# Patient Record
Sex: Female | Born: 2000 | Race: White | Hispanic: No | Marital: Married | Smoking: Never smoker
Health system: Southern US, Community
[De-identification: ages and names within clinical notes are randomized; demographics above are authoritative.]

## PROBLEM LIST (undated history)

## (undated) HISTORY — PX: APPENDECTOMY: SHX54

---

## 2019-09-29 ENCOUNTER — Encounter (HOSPITAL_COMMUNITY): Payer: Self-pay

## 2019-09-29 ENCOUNTER — Other Ambulatory Visit: Payer: Self-pay

## 2019-09-29 ENCOUNTER — Emergency Department (HOSPITAL_COMMUNITY)
Admission: EM | Admit: 2019-09-29 | Discharge: 2019-09-29 | Disposition: A | Discharge status: Home / Self Care | Payer: Self-pay | Attending: Emergency Medicine | Admitting: Emergency Medicine

## 2019-09-29 DIAGNOSIS — R509 Fever, unspecified: Secondary | ICD-10-CM | POA: Insufficient documentation

## 2019-09-29 DIAGNOSIS — Z5321 Procedure and treatment not carried out due to patient leaving prior to being seen by health care provider: Secondary | ICD-10-CM | POA: Insufficient documentation

## 2019-09-29 DIAGNOSIS — R1110 Vomiting, unspecified: Secondary | ICD-10-CM | POA: Insufficient documentation

## 2019-09-29 DIAGNOSIS — R197 Diarrhea, unspecified: Secondary | ICD-10-CM | POA: Insufficient documentation

## 2019-09-29 DIAGNOSIS — R109 Unspecified abdominal pain: Secondary | ICD-10-CM | POA: Insufficient documentation

## 2019-09-29 LAB — COMPREHENSIVE METABOLIC PANEL
ALT: 17 U/L (ref 0–44)
AST: 20 U/L (ref 15–41)
Albumin: 3.8 g/dL (ref 3.5–5.0)
Alkaline Phosphatase: 47 U/L (ref 38–126)
Anion gap: 6 (ref 5–15)
BUN: 7 mg/dL (ref 6–20)
CO2: 25 mmol/L (ref 22–32)
Calcium: 8.8 mg/dL — ABNORMAL LOW (ref 8.9–10.3)
Chloride: 106 mmol/L (ref 98–111)
Creatinine, Ser: 0.75 mg/dL (ref 0.44–1.00)
GFR calc Af Amer: >60 mL/min (ref >60)
GFR calc non Af Amer: >60 mL/min (ref >60)
Glucose, Bld: 106 mg/dL — ABNORMAL HIGH (ref 70–99)
Potassium: 3.7 mmol/L (ref 3.5–5.1)
Sodium: 137 mmol/L (ref 135–145)
Total Bilirubin: 0.4 mg/dL (ref 0.3–1.2)
Total Protein: 6.6 g/dL (ref 6.5–8.1)

## 2019-09-29 LAB — CBC
HCT: 43.6 % (ref 36.0–46.0)
Hemoglobin: 14.3 g/dL (ref 12.0–15.0)
MCH: 29.8 pg (ref 26.0–34.0)
MCHC: 32.8 g/dL (ref 30.0–36.0)
MCV: 90.8 fL (ref 80.0–100.0)
Platelets: 265 10*3/uL (ref 150–400)
RBC: 4.8 MIL/uL (ref 3.87–5.11)
RDW: 12.5 % (ref 11.5–15.5)
WBC: 5.5 10*3/uL (ref 4.0–10.5)
nRBC: 0 % (ref 0.0–0.2)

## 2019-09-29 LAB — I-STAT BETA HCG BLOOD, ED (MC, WL, AP ONLY): I-stat hCG, quantitative: <5 m[IU]/mL (ref <5)

## 2019-09-29 LAB — LIPASE, BLOOD: Lipase: 29 U/L (ref 11–51)

## 2019-09-29 NOTE — ED Notes (Signed)
Pt called for room x2. No answer.  

## 2019-09-29 NOTE — ED Triage Notes (Signed)
Pt arrives POV for eval of abd pain, N/V/D x 5 days. Pt boyfriend w/ same complaints and sx. Reports a friend that they shared a drink with has been dx'd w/ mono. Reports subjective fevers/chills. Denies sore throat.

## 2019-10-03 ENCOUNTER — Other Ambulatory Visit: Payer: Self-pay

## 2019-10-03 ENCOUNTER — Encounter (HOSPITAL_COMMUNITY): Payer: Self-pay | Admitting: Emergency Medicine

## 2019-10-03 ENCOUNTER — Emergency Department (HOSPITAL_COMMUNITY): Payer: Self-pay

## 2019-10-03 ENCOUNTER — Emergency Department (HOSPITAL_COMMUNITY)
Admission: EM | Admit: 2019-10-03 | Discharge: 2019-10-03 | Disposition: A | Discharge status: Home / Self Care | Payer: Self-pay | Attending: Emergency Medicine | Admitting: Emergency Medicine

## 2019-10-03 DIAGNOSIS — R197 Diarrhea, unspecified: Secondary | ICD-10-CM | POA: Insufficient documentation

## 2019-10-03 DIAGNOSIS — R1011 Right upper quadrant pain: Secondary | ICD-10-CM

## 2019-10-03 DIAGNOSIS — R10816 Epigastric abdominal tenderness: Secondary | ICD-10-CM | POA: Insufficient documentation

## 2019-10-03 DIAGNOSIS — R112 Nausea with vomiting, unspecified: Secondary | ICD-10-CM | POA: Insufficient documentation

## 2019-10-03 DIAGNOSIS — R10811 Right upper quadrant abdominal tenderness: Secondary | ICD-10-CM | POA: Insufficient documentation

## 2019-10-03 DIAGNOSIS — R42 Dizziness and giddiness: Secondary | ICD-10-CM | POA: Insufficient documentation

## 2019-10-03 DIAGNOSIS — R1031 Right lower quadrant pain: Secondary | ICD-10-CM | POA: Insufficient documentation

## 2019-10-03 DIAGNOSIS — R531 Weakness: Secondary | ICD-10-CM | POA: Insufficient documentation

## 2019-10-03 LAB — CBC
HCT: 44.5 % (ref 36.0–46.0)
Hemoglobin: 14.9 g/dL (ref 12.0–15.0)
MCH: 30.1 pg (ref 26.0–34.0)
MCHC: 33.5 g/dL (ref 30.0–36.0)
MCV: 89.9 fL (ref 80.0–100.0)
Platelets: 280 10*3/uL (ref 150–400)
RBC: 4.95 MIL/uL (ref 3.87–5.11)
RDW: 12.4 % (ref 11.5–15.5)
WBC: 6.6 10*3/uL (ref 4.0–10.5)
nRBC: 0 % (ref 0.0–0.2)

## 2019-10-03 LAB — COMPREHENSIVE METABOLIC PANEL
ALT: 17 U/L (ref 0–44)
AST: 18 U/L (ref 15–41)
Albumin: 4.1 g/dL (ref 3.5–5.0)
Alkaline Phosphatase: 50 U/L (ref 38–126)
Anion gap: 9 (ref 5–15)
BUN: 13 mg/dL (ref 6–20)
CO2: 22 mmol/L (ref 22–32)
Calcium: 9 mg/dL (ref 8.9–10.3)
Chloride: 106 mmol/L (ref 98–111)
Creatinine, Ser: 0.69 mg/dL (ref 0.44–1.00)
GFR calc Af Amer: >60 mL/min (ref >60)
GFR calc non Af Amer: >60 mL/min (ref >60)
Glucose, Bld: 106 mg/dL — ABNORMAL HIGH (ref 70–99)
Potassium: 4 mmol/L (ref 3.5–5.1)
Sodium: 137 mmol/L (ref 135–145)
Total Bilirubin: 0.5 mg/dL (ref 0.3–1.2)
Total Protein: 6.7 g/dL (ref 6.5–8.1)

## 2019-10-03 LAB — I-STAT BETA HCG BLOOD, ED (MC, WL, AP ONLY): I-stat hCG, quantitative: <5 m[IU]/mL (ref <5)

## 2019-10-03 LAB — URINALYSIS, ROUTINE W REFLEX MICROSCOPIC
Bilirubin Urine: NEGATIVE
Glucose, UA: NEGATIVE mg/dL
Hgb urine dipstick: NEGATIVE
Ketones, ur: NEGATIVE mg/dL
Leukocytes,Ua: NEGATIVE
Nitrite: NEGATIVE
Protein, ur: NEGATIVE mg/dL
Specific Gravity, Urine: 1.02 (ref 1.005–1.030)
pH: 5 (ref 5.0–8.0)

## 2019-10-03 LAB — LIPASE, BLOOD: Lipase: 23 U/L (ref 11–51)

## 2019-10-03 MED ORDER — SODIUM CHLORIDE 0.9% FLUSH: 3.0000 mL | Freq: Once | INTRAVENOUS | Status: DC

## 2019-10-03 MED ORDER — KETOROLAC TROMETHAMINE 30 MG/ML IJ SOLN
30.0000 mg | Freq: Once | INTRAMUSCULAR | Status: AC
  Administered 2019-10-03: 30 mg via INTRAVENOUS
  Filled 2019-10-03: qty 1

## 2019-10-03 MED ORDER — PROMETHAZINE HCL 25 MG/ML IJ SOLN
12.5000 mg | Freq: Once | INTRAMUSCULAR | Status: AC
  Administered 2019-10-03: 12.5 mg via INTRAVENOUS
  Filled 2019-10-03: qty 1

## 2019-10-03 MED ORDER — PROMETHAZINE HCL 25 MG PO TABS
25.0000 mg | ORAL_TABLET | Freq: Four times a day (QID) | ORAL | 0 refills | Status: DC | PRN
Start: 2019-10-03 — End: 2020-03-19

## 2019-10-03 MED ORDER — SODIUM CHLORIDE 0.9 % IV BOLUS
1000.0000 mL | Freq: Once | INTRAVENOUS | Status: AC
  Administered 2019-10-03: 1000 mL via INTRAVENOUS

## 2019-10-03 MED ORDER — FAMOTIDINE 20 MG PO TABS
20.0000 mg | ORAL_TABLET | Freq: Two times a day (BID) | ORAL | 0 refills | Status: DC
Start: 2019-10-03 — End: 2020-02-20

## 2019-10-03 MED ORDER — SUCRALFATE 1 G PO TABS
1.0000 g | ORAL_TABLET | Freq: Three times a day (TID) | ORAL | 0 refills | Status: DC
Start: 2019-10-03 — End: 2020-03-19

## 2019-10-03 NOTE — ED Provider Notes (Signed)
[facility] EMERGENCY DEPARTMENT Provider Note   CSN: [id number] Arrival date & time: 10/03/19  1013     History Chief Complaint  Patient presents with  . Abdominal Pain  . Dizziness    Stacy Dougherty is a 19 y.o. female presents for evaluation of acute onset, persistent right sided abdominal pain for 1.5 weeks with associated nausea, vomiting, diarrhea for 5 days.  Reports feeling lightheaded and generally weak due to vomiting.  States that she has had at least 3 episodes of nonbloody nonbilious emesis daily for the last 5 days and several episodes of watery nonbloody diarrhea.  She denies urinary symptoms, vaginal itching, bleeding, discharge, fevers, chest pain, or shortness of breath.  She reports that her significant other also developed similar symptoms but his symptoms seem to be improving and hers have persisted.  She reports that she initially thought that their symptoms could have been due to cooking chicken that she deemed to be suspicious the day before their symptoms began.  She denies recent travel or recent treatment with antibiotics.  Her significant other was evaluated in the ED at some point and she states that he was diagnosed with gastritis.  She has been taking Zofran, smoking marijuana without relief of symptoms.  She also endorses sore throat that began after the vomiting and she states "I think is just the burning from the vomiting".  She also notes that her significant other shared a drink with someone who had mononucleosis.  She is status post appendectomy. The history is provided by the patient.       History reviewed. No pertinent past medical history.  There are no problems to display for this patient.   History reviewed. No pertinent surgical history.   OB History   No obstetric history on file.     History reviewed. No pertinent family history.  Social History   Tobacco Use  . Smoking status: Not on file  Substance Use Topics  .  Alcohol use: Not on file  . Drug use: Not on file    Home Medications Prior to Admission medications   Medication Sig Start Date End Date Taking? Authorizing Provider  acetaminophen (TYLENOL) 500 MG tablet Take 500-1,000 mg by mouth every 6 (six) hours as needed for mild pain or headache.   Yes [provider]  ibuprofen (ADVIL) 200 MG tablet Take 200-600 mg by mouth every 6 (six) hours as needed for headache or mild pain.   Yes [provider]  famotidine (PEPCID) 20 MG tablet Take 1 tablet (20 mg total) by mouth 2 (two) times daily. 10/03/19   Luevenia Maxin, Quantisha Marsicano A, PA-C  promethazine (PHENERGAN) 25 MG tablet Take 1 tablet (25 mg total) by mouth every 6 (six) hours as needed for nausea or vomiting. 10/03/19   Michela Pitcher A, PA-C  sucralfate (CARAFATE) 1 g tablet Take 1 tablet (1 g total) by mouth 4 (four) times daily -  with meals and at bedtime. 10/03/19   Michela Pitcher A, PA-C    Allergies    Latex  Review of Systems   Review of Systems  Constitutional: Negative for fever.  Respiratory: Negative for cough and shortness of breath.   Cardiovascular: Negative for chest pain.  Gastrointestinal: Positive for abdominal pain, diarrhea, nausea and vomiting.  Genitourinary: Negative for dysuria, frequency, hematuria, urgency, vaginal bleeding, vaginal discharge and vaginal pain.  All other systems reviewed and are negative.   Physical Exam Updated Vital Signs BP 112/67 (BP Location: Right  Arm)   Pulse 73   Temp 98.4 F (36.9 C) (Oral)   Resp 17   SpO2 98%   Physical Exam Vitals and nursing note reviewed.  Constitutional:      General: She is not in acute distress.    Appearance: She is well-developed.  HENT:     Head: Normocephalic and atraumatic.     Mouth/Throat:     Mouth: Mucous membranes are moist.     Pharynx: No pharyngeal swelling or oropharyngeal exudate.     Comments: Tolerating secretions without difficulty.  Posterior oropharynx without any erythema,  tonsillar hypertrophy, exudates, uvular deviation, trismus or sublingual abnormalities. Eyes:     General:        Right eye: No discharge.        Left eye: No discharge.     Conjunctiva/sclera: Conjunctivae normal.  Neck:     Vascular: No JVD.     Trachea: No tracheal deviation.  Cardiovascular:     Rate and Rhythm: Normal rate and regular rhythm.  Pulmonary:     Effort: Pulmonary effort is normal.     Breath sounds: Normal breath sounds.  Abdominal:     General: Abdomen is flat. A surgical scar is present. Bowel sounds are normal. There is no distension.     Palpations: Abdomen is soft.     Tenderness: There is abdominal tenderness in the right upper quadrant, right lower quadrant and epigastric area. There is no right CVA tenderness, left CVA tenderness, guarding or rebound. Negative signs include Murphy's sign and Rovsing's sign.  Skin:    General: Skin is warm and dry.     Findings: No erythema.  Neurological:     Mental Status: She is alert.  Psychiatric:        Behavior: Behavior normal.     ED Results / Procedures / Treatments   Labs (all labs ordered are listed, but only abnormal results are displayed) Labs Reviewed  COMPREHENSIVE METABOLIC PANEL - Abnormal; Notable for the following components:      Result Value   Glucose, Bld 106 (*)    All other components within normal limits  LIPASE, BLOOD  CBC  URINALYSIS, ROUTINE W REFLEX MICROSCOPIC  I-STAT BETA HCG BLOOD, ED ([facility], [facility], [facility] ONLY)    EKG None  Radiology US Abdomen Limited RUQ  Result Date: 10/03/2019 CLINICAL DATA:  Right upper quadrant pain EXAM: ULTRASOUND ABDOMEN LIMITED RIGHT UPPER QUADRANT COMPARISON:  None. FINDINGS: Gallbladder: No gallstones or wall thickening visualized. No sonographic Murphy sign noted by sonographer. Common bile duct: Diameter: 3 mm Liver: Liver is slightly echogenic. No focal hepatic abnormality portal vein is patent on color Doppler imaging with normal direction of blood flow  towards the liver. Other: None. IMPRESSION: 1. Negative for gallstones or biliary dilatation 2. Slightly echogenic liver as may be seen with steatosis Electronically Signed   By: Jasmine Pang M.D.   On: 10/03/2019 19:55    Procedures Procedures (including critical care time)  Medications Ordered in ED Medications  sodium chloride flush (NS) 0.9 % injection 3 mL (3 mLs Intravenous Not Given 10/03/19 1819)  sodium chloride 0.9 % bolus 1,000 mL (0 mLs Intravenous Stopped 10/03/19 2024)  promethazine (PHENERGAN) injection 12.5 mg (12.5 mg Intravenous Given 10/03/19 1937)  ketorolac (TORADOL) 30 MG/ML injection 30 mg (30 mg Intravenous Given 10/03/19 2105)    ED Course  I have reviewed the triage vital signs and the nursing notes.  Pertinent labs & imaging results that were  available during my care of the patient were reviewed by me and considered in my medical decision making (see chart for details).    MDM Rules/Calculators/A&P                          Patient with 1-1/2 weeks of abdominal pain with 5 days of nausea, vomiting, diarrhea.  She is afebrile, vital signs are stable.  She is nontoxic in appearance.  Also complained of sore throat but thinks it is due to the persistent vomiting.  Examination of the posterior oropharynx is not concerning for strep pharyngitis, PTA, mononucleosis, retropharyngeal abscess or deep space neck infection.  Her abdomen is soft with no rebound or guarding.  She is status post appendectomy.  Given her significant other had very similar symptoms after eating chicken and she has no GU complaints I doubt PID, TOA, ectopic pregnancy or ovarian torsion.  Given right-sided abdominal pains we obtained a right upper quadrant ultrasound which shows no evidence of gallstones or biliary dilatation.  Liver is slightly echogenic which could be seen in the setting of steatosis.  Lab work reviewed and interpreted by myself shows no leukocytosis, no anemia, no metabolic  derangements or renal insufficiency.  LFTs and lipase are within normal limits which is reassuring that she is likely not experiencing pancreatitis or other hepatobiliary dysfunction.  UA does not suggest UTI or nephrolithiasis.  Given the duration of her symptoms I would expect if there is a serious cause of her symptoms that we would see some lab abnormalities suggestive of this.  For this reason we did not obtain emergent CT scan of the abdomen and pelvis.  I doubt acute surgical abdominal pathology including obstruction, perforation, diverticulitis, nephrolithiasis.  On reevaluation the patient is resting comfortably in no apparent distress.  Reports she is feeling better after fluids, Phenergan, and Toradol.  She has tolerated sips of fluid in the ED.  Serial abdominal examinations remain benign.  Suspect likely gastroenteritis.  Will discharge with symptomatic management and discussed advancing diet slowly, pushing fluids.  Also offered outpatient Covid testing which she declined.  Recommend follow-up with PCP on an outpatient basis for reevaluation of symptoms.  Discussed strict ED return precautions.  Patient and visitor at the bedside verbalized understanding of and agreement with plan and patient is stable for discharge at this time.  Final Clinical Impression(s) / ED Diagnoses Final diagnoses:  RUQ abdominal pain  Nausea vomiting and diarrhea    Rx / DC Orders ED Discharge Orders         Ordered    promethazine (PHENERGAN) 25 MG tablet  Every 6 hours PRN     Discontinue  Reprint     10/03/19 2107    famotidine (PEPCID) 20 MG tablet  2 times daily     Discontinue  Reprint     10/03/19 2107    sucralfate (CARAFATE) 1 g tablet  3 times daily with meals & bedtime     Discontinue  Reprint     10/03/19 2107           Renita Papa, PA-C 10/03/19 2112    Drenda Freeze, MD 10/03/19 [phone]

## 2019-10-03 NOTE — ED Notes (Signed)
Pt given sprite to drink. 

## 2019-10-03 NOTE — Discharge Instructions (Signed)
1. Medications: Alternate 600 mg of ibuprofen and [phone] mg of Tylenol every 3 hours as needed for pain. Do not exceed 4000 mg of Tylenol daily.  Take ibuprofen with food to avoid upset stomach.  Take Phenergan as needed for nausea.  Wait around 20 minutes before eating or drinking after taking this medication.  Do not take Zofran at the same time as you take Phenergan.  Take famotidine twice daily with meals.  Carafate with meals and at night. 2. Treatment: rest, drink plenty of fluids, advance diet slowly.  Start with water and broth then advance to bland foods that will not upset your stomach such as crackers, mashed potatoes, and peanut butter. 3. Follow Up: Please followup with your primary doctor in 3 days for discussion of your diagnoses and further evaluation after today's visit; if you do not have a primary care doctor use the resource guide provided to find one; Please return to the ER for persistent vomiting, high fevers or worsening symptoms

## 2019-10-03 NOTE — ED Notes (Signed)
Pt also states her apartment is filled with black mold

## 2019-10-03 NOTE — ED Triage Notes (Signed)
Patient arrives to ED with complaints of RLQ abdominal pain, dizziness, N/V/D, and sore throat x1.5 weeks. Patient states that she feels like she is continuing to get worse. Was seen here for same on 6/18. Boyfriend has same complaints.

## 2019-10-03 NOTE — ED Notes (Signed)
Taken to US.

## 2019-10-03 NOTE — ED Notes (Signed)
Pt transported to US

## 2020-02-20 ENCOUNTER — Emergency Department (HOSPITAL_COMMUNITY)
Admission: EM | Admit: 2020-02-20 | Discharge: 2020-02-20 | Disposition: A | Discharge status: Home / Self Care | Payer: Medicaid Other | Attending: Emergency Medicine | Admitting: Emergency Medicine

## 2020-02-20 ENCOUNTER — Encounter (HOSPITAL_COMMUNITY): Payer: Self-pay | Admitting: Emergency Medicine

## 2020-02-20 ENCOUNTER — Other Ambulatory Visit: Payer: Self-pay

## 2020-02-20 DIAGNOSIS — R197 Diarrhea, unspecified: Secondary | ICD-10-CM | POA: Insufficient documentation

## 2020-02-20 DIAGNOSIS — R112 Nausea with vomiting, unspecified: Secondary | ICD-10-CM | POA: Insufficient documentation

## 2020-02-20 DIAGNOSIS — R5383 Other fatigue: Secondary | ICD-10-CM | POA: Insufficient documentation

## 2020-02-20 DIAGNOSIS — Z91040 Latex allergy status: Secondary | ICD-10-CM | POA: Diagnosis not present

## 2020-02-20 DIAGNOSIS — R6883 Chills (without fever): Secondary | ICD-10-CM | POA: Diagnosis not present

## 2020-02-20 LAB — CBC
HCT: 44.5 % (ref 36.0–46.0)
Hemoglobin: 15.1 g/dL — ABNORMAL HIGH (ref 12.0–15.0)
MCH: 30.1 pg (ref 26.0–34.0)
MCHC: 33.9 g/dL (ref 30.0–36.0)
MCV: 88.8 fL (ref 80.0–100.0)
Platelets: 314 10*3/uL (ref 150–400)
RBC: 5.01 MIL/uL (ref 3.87–5.11)
RDW: 12.6 % (ref 11.5–15.5)
WBC: 9.2 10*3/uL (ref 4.0–10.5)
nRBC: 0 % (ref 0.0–0.2)

## 2020-02-20 LAB — COMPREHENSIVE METABOLIC PANEL
ALT: 16 U/L (ref 0–44)
AST: 17 U/L (ref 15–41)
Albumin: 4.5 g/dL (ref 3.5–5.0)
Alkaline Phosphatase: 53 U/L (ref 38–126)
Anion gap: 13 (ref 5–15)
BUN: 9 mg/dL (ref 6–20)
CO2: 20 mmol/L — ABNORMAL LOW (ref 22–32)
Calcium: 9.4 mg/dL (ref 8.9–10.3)
Chloride: 103 mmol/L (ref 98–111)
Creatinine, Ser: 0.74 mg/dL (ref 0.44–1.00)
GFR, Estimated: >60 mL/min (ref >60)
Glucose, Bld: 92 mg/dL (ref 70–99)
Potassium: 3.6 mmol/L (ref 3.5–5.1)
Sodium: 136 mmol/L (ref 135–145)
Total Bilirubin: 1.1 mg/dL (ref 0.3–1.2)
Total Protein: 7.2 g/dL (ref 6.5–8.1)

## 2020-02-20 LAB — URINALYSIS, ROUTINE W REFLEX MICROSCOPIC
Bilirubin Urine: NEGATIVE
Glucose, UA: NEGATIVE mg/dL
Hgb urine dipstick: NEGATIVE
Ketones, ur: 80 mg/dL — AB
Nitrite: NEGATIVE
Protein, ur: 30 mg/dL — AB
Specific Gravity, Urine: 1.031 — ABNORMAL HIGH (ref 1.005–1.030)
pH: 5 (ref 5.0–8.0)

## 2020-02-20 LAB — LIPASE, BLOOD: Lipase: 26 U/L (ref 11–51)

## 2020-02-20 LAB — I-STAT BETA HCG BLOOD, ED (MC, WL, AP ONLY): I-stat hCG, quantitative: <5 m[IU]/mL (ref <5)

## 2020-02-20 MED ORDER — DICYCLOMINE HCL 10 MG PO CAPS
20.0000 mg | ORAL_CAPSULE | Freq: Once | ORAL | Status: AC
  Administered 2020-02-20: 20 mg via ORAL
  Filled 2020-02-20: qty 2

## 2020-02-20 MED ORDER — FAMOTIDINE 20 MG PO TABS: 20.0000 mg | ORAL_TABLET | Freq: Two times a day (BID) | ORAL | 0 refills | Status: AC

## 2020-02-20 MED ORDER — FAMOTIDINE 20 MG PO TABS
20.0000 mg | ORAL_TABLET | Freq: Once | ORAL | Status: AC
  Administered 2020-02-20: 20 mg via ORAL
  Filled 2020-02-20: qty 1

## 2020-02-20 MED ORDER — DICYCLOMINE HCL 20 MG PO TABS: 20.0000 mg | ORAL_TABLET | Freq: Two times a day (BID) | ORAL | 0 refills | Status: AC

## 2020-02-20 MED ORDER — ONDANSETRON 4 MG PO TBDP: 4.0000 mg | ORAL_TABLET | Freq: Three times a day (TID) | ORAL | 0 refills | Status: DC | PRN

## 2020-02-20 MED ORDER — ONDANSETRON 4 MG PO TBDP
4.0000 mg | ORAL_TABLET | Freq: Once | ORAL | Status: AC
  Administered 2020-02-20: 4 mg via ORAL
  Filled 2020-02-20: qty 1

## 2020-02-20 NOTE — ED Notes (Signed)
Pt states she feels a lot better after the medications. Will continue to monitor.

## 2020-02-20 NOTE — ED Provider Notes (Signed)
[facility] EMERGENCY DEPARTMENT Provider Note   CSN: [id number] Arrival date & time: 02/20/20  1242     History Chief Complaint  Patient presents with  . Nausea  . Emesis  . Fatigue    Stacy Dougherty is a 19 y.o. female.  HPI Patient is a 19 year old female with no pertinent past medical history presented today with nausea vomiting and diarrhea for approximately 5 days.  She denies any fevers, hematuria, frequency urgency or dysuria.  She denies any blood in her stool denies any fevers.  She has had some chills at night.  She states that she has been drinking lots of water and Gatorade.  She denies any sick contacts no recent travel out of the country.  She denies any significant abdominal pain either.  She states that sometimes it feels somewhat crampy when she first wakes up in the morning but is relieved by having a bowel movement.  She denies any present chest pain or abdominal pain.  She denies any lightheadedness, dizziness, weakness or malaise.  She denies any other associated symptoms.  She denies any aggravating or mitigating factors.  She has tried no medications prior to arrival.  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  She states this does not feel like her ulcers.    History reviewed. No pertinent past medical history.  There are no problems to display for this patient.   History reviewed. No pertinent surgical history.   OB History   No obstetric history on file.     History reviewed. No pertinent family history.  Social History   Tobacco Use  . Smoking status: Not on file  Substance Use Topics  . Alcohol use: Not on file  . Drug use: Not on file    Home Medications Prior to Admission medications   Medication Sig Start Date End Date Taking? Authorizing Provider  acetaminophen (TYLENOL) 500 MG tablet Take 500-1,000 mg by mouth  every 6 (six) hours as needed for mild pain or headache.    [provider]  dicyclomine (BENTYL) 20 MG tablet Take 1 tablet (20 mg total) by mouth 2 (two) times daily. 02/20/20   Gailen Shelter, PA  famotidine (PEPCID) 20 MG tablet Take 1 tablet (20 mg total) by mouth 2 (two) times daily. 02/20/20   Gailen Shelter, PA  ibuprofen (ADVIL) 200 MG tablet Take 200-600 mg by mouth every 6 (six) hours as needed for headache or mild pain.    [provider]  ondansetron (ZOFRAN ODT) 4 MG disintegrating tablet Take 1 tablet (4 mg total) by mouth every 8 (eight) hours as needed for nausea or vomiting. 02/20/20   Gailen Shelter, PA  promethazine (PHENERGAN) 25 MG tablet Take 1 tablet (25 mg total) by mouth every 6 (six) hours as needed for nausea or vomiting. 10/03/19   Michela Pitcher A, PA-C  sucralfate (CARAFATE) 1 g tablet Take 1 tablet (1 g total) by mouth 4 (four) times daily -  with meals and at bedtime. 10/03/19   Michela Pitcher A, PA-C    Allergies    Latex  Review of Systems   Review of Systems  Constitutional: Positive for chills and fatigue. Negative for fever.  HENT: Negative for congestion.   Eyes: Negative for pain.  Respiratory: Negative for cough and shortness of breath.   Cardiovascular: Negative for chest  pain and leg swelling.  Gastrointestinal: Positive for diarrhea, nausea and vomiting. Negative for abdominal distention and abdominal pain.  Genitourinary: Negative for dysuria.  Musculoskeletal: Negative for myalgias.  Skin: Negative for rash.  Neurological: Negative for dizziness and headaches.    Physical Exam Updated Vital Signs BP 117/71 (BP Location: Right Arm)   Pulse 63   Temp 98.3 F (36.8 C) (Oral)   Resp 16   SpO2 96%   Physical Exam Vitals and nursing note reviewed.  Constitutional:      General: She is not in acute distress. HENT:     Head: Normocephalic and atraumatic.     Nose: Nose normal.  Eyes:     General: No scleral  icterus. Cardiovascular:     Rate and Rhythm: Normal rate and regular rhythm.     Pulses: Normal pulses.     Heart sounds: Normal heart sounds.  Pulmonary:     Effort: Pulmonary effort is normal. No respiratory distress.     Breath sounds: No wheezing.  Abdominal:     Palpations: Abdomen is soft.     Tenderness: There is no abdominal tenderness.  Musculoskeletal:     Cervical back: Normal range of motion.     Right lower leg: No edema.     Left lower leg: No edema.  Skin:    General: Skin is warm and dry.     Capillary Refill: Capillary refill takes less than 2 seconds.  Neurological:     Mental Status: She is alert. Mental status is at baseline.  Psychiatric:        Mood and Affect: Mood normal.        Behavior: Behavior normal.     ED Results / Procedures / Treatments   Labs (all labs ordered are listed, but only abnormal results are displayed) Labs Reviewed  COMPREHENSIVE METABOLIC PANEL - Abnormal; Notable for the following components:      Result Value   CO2 20 (*)    All other components within normal limits  CBC - Abnormal; Notable for the following components:   Hemoglobin 15.1 (*)    All other components within normal limits  LIPASE, BLOOD  URINALYSIS, ROUTINE W REFLEX MICROSCOPIC  I-STAT BETA HCG BLOOD, ED ([facility], [facility], [facility] ONLY)    EKG None  Radiology No results found.  Procedures Procedures (including critical care time)  Medications Ordered in ED Medications  dicyclomine (BENTYL) capsule 20 mg (20 mg Oral Given 02/20/20 1510)  famotidine (PEPCID) tablet 20 mg (20 mg Oral Given 02/20/20 1510)  ondansetron (ZOFRAN-ODT) disintegrating tablet 4 mg (4 mg Oral Given 02/20/20 1510)    ED Course  I have reviewed the triage vital signs and the nursing notes.  Pertinent labs & imaging results that were available during my care of the patient were reviewed by me and considered in my medical decision making (see chart for details).    MDM  Rules/Calculators/A&P                          Patient is 19 year old female with no pertinent past medical history apart from daily marijuana use presented today with some chills diarrhea nausea and vomiting.  He has been ongoing for approximately 5 days at this point.  She denies any fevers at home.  Physical exam is notable for no significant tenderness in the abdomen no guarding or rebound.  No significant abnormal findings.  She has no CVA tenderness and no  suprapubic tenderness.  She is not having any urinary symptoms consistent with UTI.  She is afebrile and has vital signs within normal limits.  Provided patient with Zofran Bentyl and famotidine  On my reassessment she feels significantly improved and is agreeable to discharge home at this time.  CBC with mild hemoconcentration likely from dehydration.  No leukocytosis or anemia. CMP without any significant abnormalities very mildly low CO2. Lipase within normal limits pancreatitis.  I-STAT hCG negative for pregnancy.  Any lower abdominal pain that would be indicative of torsion, ectopic, PID.  She also denies any vaginal or pelvic complaints.  She deferred pelvic and exam which I think is reasonable this time.  We will discharge with conservative treatment at this time Zofran Bentyl and famotidine she will follow up with her primary care doctor return to ED if she has any new or concerning symptoms.  Final Clinical Impression(s) / ED Diagnoses Final diagnoses:  Nausea vomiting and diarrhea    Rx / DC Orders ED Discharge Orders         Ordered    ondansetron (ZOFRAN ODT) 4 MG disintegrating tablet  Every 8 hours PRN        02/20/20 1612    famotidine (PEPCID) 20 MG tablet  2 times daily        02/20/20 1612    dicyclomine (BENTYL) 20 MG tablet  2 times daily        02/20/20 1612           [place], [place] 02/20/20 1616    Cathren Laine, MD 02/22/20 1542

## 2020-02-20 NOTE — Discharge Instructions (Signed)
Take medications as prescribed.  Please drink plenty of water.  Please monitor your symptoms and return the emergency department for new or concerning symptoms.  Refrain from using ibuprofen or alcohol. Also refrain from using marijuana

## 2020-02-20 NOTE — ED Notes (Signed)
Patient tolerating oral fluids well at this time

## 2020-02-20 NOTE — ED Triage Notes (Signed)
Pt coming from home. Complaint of nausea vomiting and fatigue for 4 days. Pt states she has not been able to keep any food down.

## 2020-03-08 ENCOUNTER — Emergency Department (HOSPITAL_COMMUNITY)
Admission: EM | Admit: 2020-03-08 | Discharge: 2020-03-08 | Disposition: A | Discharge status: Home / Self Care | Payer: Medicaid Other | Attending: Emergency Medicine | Admitting: Emergency Medicine

## 2020-03-08 ENCOUNTER — Emergency Department (HOSPITAL_COMMUNITY): Payer: Medicaid Other

## 2020-03-08 ENCOUNTER — Other Ambulatory Visit: Payer: Self-pay

## 2020-03-08 DIAGNOSIS — R52 Pain, unspecified: Secondary | ICD-10-CM

## 2020-03-08 DIAGNOSIS — R350 Frequency of micturition: Secondary | ICD-10-CM | POA: Diagnosis not present

## 2020-03-08 DIAGNOSIS — R109 Unspecified abdominal pain: Secondary | ICD-10-CM | POA: Insufficient documentation

## 2020-03-08 DIAGNOSIS — Z91040 Latex allergy status: Secondary | ICD-10-CM | POA: Insufficient documentation

## 2020-03-08 DIAGNOSIS — R300 Dysuria: Secondary | ICD-10-CM | POA: Insufficient documentation

## 2020-03-08 LAB — I-STAT BETA HCG BLOOD, ED (MC, WL, AP ONLY): I-stat hCG, quantitative: >2000 m[IU]/mL — ABNORMAL HIGH (ref <5)

## 2020-03-08 LAB — CBC WITH DIFFERENTIAL/PLATELET
Abs Immature Granulocytes: 0.02 10*3/uL (ref 0.00–0.07)
Basophils Absolute: 0 10*3/uL (ref 0.0–0.1)
Basophils Relative: 1 %
Eosinophils Absolute: 0.1 10*3/uL (ref 0.0–0.5)
Eosinophils Relative: 1 %
HCT: 44.1 % (ref 36.0–46.0)
Hemoglobin: 15.1 g/dL — ABNORMAL HIGH (ref 12.0–15.0)
Immature Granulocytes: 0 %
Lymphocytes Relative: 24 %
Lymphs Abs: 1.7 10*3/uL (ref 0.7–4.0)
MCH: 30.6 pg (ref 26.0–34.0)
MCHC: 34.2 g/dL (ref 30.0–36.0)
MCV: 89.3 fL (ref 80.0–100.0)
Monocytes Absolute: 0.5 10*3/uL (ref 0.1–1.0)
Monocytes Relative: 7 %
Neutro Abs: 4.8 10*3/uL (ref 1.7–7.7)
Neutrophils Relative %: 67 %
Platelets: 270 10*3/uL (ref 150–400)
RBC: 4.94 MIL/uL (ref 3.87–5.11)
RDW: 13 % (ref 11.5–15.5)
WBC: 7.1 10*3/uL (ref 4.0–10.5)
nRBC: 0 % (ref 0.0–0.2)

## 2020-03-08 LAB — WET PREP, GENITAL
Sperm: NONE SEEN
Trich, Wet Prep: NONE SEEN
Yeast Wet Prep HPF POC: NONE SEEN

## 2020-03-08 LAB — HIV ANTIBODY (ROUTINE TESTING W REFLEX): HIV Screen 4th Generation wRfx: NONREACTIVE

## 2020-03-08 LAB — BASIC METABOLIC PANEL
Anion gap: 10 (ref 5–15)
BUN: 10 mg/dL (ref 6–20)
CO2: 20 mmol/L — ABNORMAL LOW (ref 22–32)
Calcium: 8.9 mg/dL (ref 8.9–10.3)
Chloride: 106 mmol/L (ref 98–111)
Creatinine, Ser: 0.78 mg/dL (ref 0.44–1.00)
GFR, Estimated: >60 mL/min (ref >60)
Glucose, Bld: 105 mg/dL — ABNORMAL HIGH (ref 70–99)
Potassium: 3.7 mmol/L (ref 3.5–5.1)
Sodium: 136 mmol/L (ref 135–145)

## 2020-03-08 MED ORDER — ONDANSETRON 4 MG PO TBDP
8.0000 mg | ORAL_TABLET | Freq: Once | ORAL | Status: AC
  Administered 2020-03-08: 8 mg via ORAL
  Filled 2020-03-08: qty 2

## 2020-03-08 NOTE — Discharge Instructions (Addendum)
Follow-up with a obstetrician for further care in 2 weeks.  You will need repeat ultrasound and repeat blood testing.  You can call the Center for [facility] at [facility], to get an appointment.  Phone number is [phone]  If you experience severe pain, vaginal bleeding, dizziness or weakness go to the maternity admissions unit, at [facility]

## 2020-03-08 NOTE — ED Notes (Signed)
Pt provided with peanut butter and crackers per Dr Effie Shy ok

## 2020-03-08 NOTE — ED Provider Notes (Signed)
[facility] EMERGENCY DEPARTMENT Provider Note   CSN: [id number] Arrival date & time: 03/08/20  2751     History No chief complaint on file.   Stacy Dougherty is a 19 y.o. female.  HPI She presents for evaluation of cramping abdominal pain with missed menses for 3 weeks.  She also complains of dysuria with urinary frequency.  She has abdominal pain extending from the epigastrium to the suprapubic region, vagina and anus.  She denies fever but has felt hot at times.  She is not having cough, chest pain, weakness or dizziness.  She states that she is trying to get pregnant.  She has not had Covid vaccines.  There are no other known modifying factors.    No past medical history on file.  There are no problems to display for this patient.   No past surgical history on file.   OB History   No obstetric history on file.     No family history on file.  Social History   Tobacco Use  . Smoking status: Not on file  Substance Use Topics  . Alcohol use: Not on file  . Drug use: Not on file    Home Medications Prior to Admission medications   Medication Sig Start Date End Date Taking? Authorizing Provider  acetaminophen (TYLENOL) 500 MG tablet Take 500-1,000 mg by mouth every 6 (six) hours as needed for mild pain or headache.    [provider]  dicyclomine (BENTYL) 20 MG tablet Take 1 tablet (20 mg total) by mouth 2 (two) times daily. 02/20/20   Gailen Shelter, PA  famotidine (PEPCID) 20 MG tablet Take 1 tablet (20 mg total) by mouth 2 (two) times daily. 02/20/20   Gailen Shelter, PA  ibuprofen (ADVIL) 200 MG tablet Take 200-600 mg by mouth every 6 (six) hours as needed for headache or mild pain.    [provider]  ondansetron (ZOFRAN ODT) 4 MG disintegrating tablet Take 1 tablet (4 mg total) by mouth every 8 (eight) hours as needed for nausea or vomiting. 02/20/20   Gailen Shelter, PA  promethazine (PHENERGAN) 25 MG tablet Take 1 tablet (25  mg total) by mouth every 6 (six) hours as needed for nausea or vomiting. 10/03/19   Michela Pitcher A, PA-C  sucralfate (CARAFATE) 1 g tablet Take 1 tablet (1 g total) by mouth 4 (four) times daily -  with meals and at bedtime. 10/03/19   Michela Pitcher A, PA-C    Allergies    Latex  Review of Systems   Review of Systems  All other systems reviewed and are negative.   Physical Exam Updated Vital Signs There were no vitals taken for this visit.  Physical Exam Vitals and nursing note reviewed.  Constitutional:      Appearance: She is well-developed.  HENT:     Head: Normocephalic and atraumatic.     Mouth/Throat:     Mouth: Mucous membranes are moist.     Pharynx: No oropharyngeal exudate or posterior oropharyngeal erythema.  Eyes:     Conjunctiva/sclera: Conjunctivae normal.     Pupils: Pupils are equal, round, and reactive to light.  Neck:     Trachea: Phonation normal.  Cardiovascular:     Rate and Rhythm: Normal rate and regular rhythm.  Pulmonary:     Effort: Pulmonary effort is normal.     Breath sounds: Normal breath sounds.  Chest:     Chest wall: No tenderness.  Abdominal:  General: There is no distension.     Palpations: Abdomen is soft.     Tenderness: There is no abdominal tenderness. There is no guarding.  Genitourinary:    Comments: Normal external female genitalia.  Small amount of vaginal discharge, slightly opaque.  On bimanual examination there is mild left adnexal tenderness without palpable mass or deformity.  Uterus is not enlarged.  No tenderness or mass on the right adnexa Musculoskeletal:        General: Normal range of motion.     Cervical back: Normal range of motion and neck supple.  Skin:    General: Skin is warm and dry.  Neurological:     Mental Status: She is alert and oriented to person, place, and time.     Motor: No abnormal muscle tone.  Psychiatric:        Mood and Affect: Mood normal.        Behavior: Behavior normal.        Thought  Content: Thought content normal.        Judgment: Judgment normal.     ED Results / Procedures / Treatments   Labs (all labs ordered are listed, but only abnormal results are displayed) Labs Reviewed - No data to display  EKG None  Radiology No results found.  Procedures Procedures (including critical care time)  Medications Ordered in ED Medications - No data to display  ED Course  I have reviewed the triage vital signs and the nursing notes.  Pertinent labs & imaging results that were available during my care of the patient were reviewed by me and considered in my medical decision making (see chart for details).    MDM Rules/Calculators/A&P                           Patient Vitals for the past 24 hrs:  BP Temp Temp src Pulse Resp SpO2  03/08/20 0809 128/80 97.9 F (36.6 C) Oral 94 18 99 %    5:13 PM Reevaluation with update and discussion. After initial assessment and treatment, an updated evaluation reveals she is comfortable, vital signs stable.  Findings discussed with patient all questions were answered. Mancel Bale   Medical Decision Making:  This patient is presenting for evaluation of abdominal pain and missed menses, which does require a range of treatment options, and is a complaint that involves a high risk of morbidity and mortality. The differential diagnoses include UTI, pregnancy, intestinal disorder. I decided to review old records, and in summary healthy young female presenting with missed menses and abdominal pain.  Frequent ED visits for gastrointestinal difficulty.  I did not require additional historical information from anyone.  Clinical Laboratory Tests Ordered, included CBC, Metabolic panel, Pregnancy test and Quantitative hCG, wet prep, GC and Chlamydia testing, HIV testing. Review indicates positive pregnancy test, few clue cells on wet prep, with many RBCs.  No clinical evidence for cervicitis. Radiologic Tests Ordered, included ultrasound  pelvis.  I independently Visualized: Uncomplicated 5-week pregnancy images     Critical Interventions-clinical evaluation, laboratory testing, ultrasound imaging, observation reassessment  After These Interventions, the Patient was reevaluated and was found stable for discharge.  At this point pregnancy appears uncomplicated.  She is not having vaginal bleeding.  Pain is controlled she does not have an obstetrician so was referred to the outpatient [facility] center for women.  She is instructed to return to the [facility] if needed for complications  CRITICAL CARE-no Performed  by: Mancel Bale  Nursing Notes Reviewed/ Care Coordinated Applicable Imaging Reviewed Interpretation of Laboratory Data incorporated into ED treatment  The patient appears reasonably screened and/or stabilized for discharge and I doubt any other medical condition or other [facility] requiring further screening, evaluation, or treatment in the ED at this time prior to discharge.  Plan: Home Medications-OTC as needed; Home Treatments-rest, fluids; return here if the recommended treatment, does not improve the symptoms; Recommended follow up-PCP, as needed     Final Clinical Impression(s) / ED Diagnoses Final diagnoses:  None    Rx / DC Orders ED Discharge Orders    None       Mancel Bale, MD 03/08/20 1715

## 2020-03-09 LAB — RPR: RPR Ser Ql: NONREACTIVE

## 2020-03-11 LAB — GC/CHLAMYDIA PROBE AMP (~~LOC~~) NOT AT ARMC
Chlamydia: NEGATIVE
Comment: NEGATIVE
Comment: NORMAL
Neisseria Gonorrhea: NEGATIVE

## 2020-03-19 ENCOUNTER — Inpatient Hospital Stay (HOSPITAL_COMMUNITY)
Admission: AD | Admit: 2020-03-19 | Discharge: 2020-03-19 | Disposition: A | Discharge status: Home / Self Care | Payer: Medicaid Other | Attending: Emergency Medicine | Admitting: Emergency Medicine

## 2020-03-19 ENCOUNTER — Encounter (HOSPITAL_COMMUNITY): Payer: Self-pay | Admitting: *Deleted

## 2020-03-19 ENCOUNTER — Inpatient Hospital Stay (HOSPITAL_COMMUNITY): Payer: Medicaid Other

## 2020-03-19 ENCOUNTER — Other Ambulatory Visit: Payer: Self-pay

## 2020-03-19 DIAGNOSIS — O211 Hyperemesis gravidarum with metabolic disturbance: Secondary | ICD-10-CM | POA: Diagnosis not present

## 2020-03-19 DIAGNOSIS — O210 Mild hyperemesis gravidarum: Secondary | ICD-10-CM

## 2020-03-19 DIAGNOSIS — Z3A01 Less than 8 weeks gestation of pregnancy: Secondary | ICD-10-CM | POA: Insufficient documentation

## 2020-03-19 DIAGNOSIS — E876 Hypokalemia: Secondary | ICD-10-CM | POA: Diagnosis not present

## 2020-03-19 DIAGNOSIS — O219 Vomiting of pregnancy, unspecified: Secondary | ICD-10-CM | POA: Insufficient documentation

## 2020-03-19 DIAGNOSIS — Z91040 Latex allergy status: Secondary | ICD-10-CM | POA: Diagnosis not present

## 2020-03-19 LAB — URINALYSIS, ROUTINE W REFLEX MICROSCOPIC
Bilirubin Urine: NEGATIVE
Glucose, UA: NEGATIVE mg/dL
Hgb urine dipstick: NEGATIVE
Ketones, ur: 20 mg/dL — AB
Leukocytes,Ua: NEGATIVE
Nitrite: NEGATIVE
Protein, ur: NEGATIVE mg/dL
Specific Gravity, Urine: 1.03 (ref 1.005–1.030)
pH: 5 (ref 5.0–8.0)

## 2020-03-19 LAB — COMPREHENSIVE METABOLIC PANEL
ALT: 14 U/L (ref 0–44)
AST: 16 U/L (ref 15–41)
Albumin: 3.8 g/dL (ref 3.5–5.0)
Alkaline Phosphatase: 46 U/L (ref 38–126)
Anion gap: 11 (ref 5–15)
BUN: 7 mg/dL (ref 6–20)
CO2: 22 mmol/L (ref 22–32)
Calcium: 9 mg/dL (ref 8.9–10.3)
Chloride: 103 mmol/L (ref 98–111)
Creatinine, Ser: 0.71 mg/dL (ref 0.44–1.00)
GFR, Estimated: >60 mL/min (ref >60)
Glucose, Bld: 101 mg/dL — ABNORMAL HIGH (ref 70–99)
Potassium: 3.2 mmol/L — ABNORMAL LOW (ref 3.5–5.1)
Sodium: 136 mmol/L (ref 135–145)
Total Bilirubin: 0.7 mg/dL (ref 0.3–1.2)
Total Protein: 6.6 g/dL (ref 6.5–8.1)

## 2020-03-19 LAB — CBC
HCT: 41.6 % (ref 36.0–46.0)
Hemoglobin: 14.3 g/dL (ref 12.0–15.0)
MCH: 30.1 pg (ref 26.0–34.0)
MCHC: 34.4 g/dL (ref 30.0–36.0)
MCV: 87.6 fL (ref 80.0–100.0)
Platelets: 286 10*3/uL (ref 150–400)
RBC: 4.75 MIL/uL (ref 3.87–5.11)
RDW: 12.5 % (ref 11.5–15.5)
WBC: 9.3 10*3/uL (ref 4.0–10.5)
nRBC: 0 % (ref 0.0–0.2)

## 2020-03-19 LAB — RAPID URINE DRUG SCREEN, HOSP PERFORMED
Amphetamines: NOT DETECTED
Barbiturates: NOT DETECTED
Benzodiazepines: NOT DETECTED
Cocaine: NOT DETECTED
Opiates: NOT DETECTED
Tetrahydrocannabinol: POSITIVE — AB

## 2020-03-19 LAB — LIPASE, BLOOD: Lipase: 24 U/L (ref 11–51)

## 2020-03-19 MED ORDER — PROMETHAZINE HCL 25 MG/ML IJ SOLN
25.0000 mg | Freq: Four times a day (QID) | INTRAMUSCULAR | Status: DC | PRN
  Administered 2020-03-19: 25 mg via INTRAVENOUS
  Filled 2020-03-19: qty 1

## 2020-03-19 MED ORDER — SCOPOLAMINE 1 MG/3DAYS TD PT72: 1.0000 | MEDICATED_PATCH | TRANSDERMAL | 1 refills | Status: AC

## 2020-03-19 MED ORDER — LACTATED RINGERS IV BOLUS
1000.0000 mL | Freq: Once | INTRAVENOUS | Status: AC
  Administered 2020-03-19: 1000 mL via INTRAVENOUS

## 2020-03-19 MED ORDER — SCOPOLAMINE 1 MG/3DAYS TD PT72
1.0000 | MEDICATED_PATCH | TRANSDERMAL | Status: DC
  Administered 2020-03-19: 1.5 mg via TRANSDERMAL
  Filled 2020-03-19: qty 1

## 2020-03-19 MED ORDER — PROMETHAZINE HCL 25 MG PO TABS
12.5000 mg | ORAL_TABLET | Freq: Four times a day (QID) | ORAL | 0 refills | Status: AC | PRN
Start: 2020-03-19 — End: ?

## 2020-03-19 MED ORDER — POTASSIUM CHLORIDE CRYS ER 20 MEQ PO TBCR: 20.0000 meq | EXTENDED_RELEASE_TABLET | Freq: Two times a day (BID) | ORAL | 0 refills | Status: AC

## 2020-03-19 NOTE — ED Provider Notes (Signed)
[facility] EMERGENCY DEPARTMENT Provider Note   CSN: [id number] Arrival date & time: 03/19/20  0302     History No chief complaint on file.   Stacy Dougherty is a 19 y.o. female.  Patient presents with complaints of abdominal cramping nausea and vomiting.  She reports that she is not able to hold anything down.  Patient is 6-[redacted] weeks pregnant.  She has been having daily vomiting since pregnancy diagnosis.  No vaginal bleeding or discharge.        No past medical history on file.  There are no problems to display for this patient.   No past surgical history on file.   OB History   No obstetric history on file.     No family history on file.  Social History   Tobacco Use  . Smoking status: Not on file  Substance Use Topics  . Alcohol use: Not on file  . Drug use: Not on file    Home Medications Prior to Admission medications   Medication Sig Start Date End Date Taking? Authorizing Provider  acetaminophen (TYLENOL) 500 MG tablet Take 500-1,000 mg by mouth every 6 (six) hours as needed for mild pain or headache.    [provider]  dicyclomine (BENTYL) 20 MG tablet Take 1 tablet (20 mg total) by mouth 2 (two) times daily. Patient not taking: Reported on 03/08/2020 02/20/20   Gailen Shelter, PA  famotidine (PEPCID) 20 MG tablet Take 1 tablet (20 mg total) by mouth 2 (two) times daily. Patient not taking: Reported on 03/08/2020 02/20/20   Gailen Shelter, PA  ibuprofen (ADVIL) 200 MG tablet Take 200-600 mg by mouth every 6 (six) hours as needed for headache or mild pain.    [provider]  ondansetron (ZOFRAN ODT) 4 MG disintegrating tablet Take 1 tablet (4 mg total) by mouth every 8 (eight) hours as needed for nausea or vomiting. 02/20/20   Gailen Shelter, PA  promethazine (PHENERGAN) 25 MG tablet Take 1 tablet (25 mg total) by mouth every 6 (six) hours as needed for nausea or vomiting. 10/03/19   Michela Pitcher A, PA-C  sucralfate  (CARAFATE) 1 g tablet Take 1 tablet (1 g total) by mouth 4 (four) times daily -  with meals and at bedtime. Patient not taking: Reported on 03/08/2020 10/03/19   Michela Pitcher A, PA-C    Allergies    Latex  Review of Systems   Review of Systems  Gastrointestinal: Positive for abdominal pain, nausea and vomiting.  All other systems reviewed and are negative.   Physical Exam Updated Vital Signs BP 120/69 (BP Location: Right Arm)   Pulse 90   Temp (!) 97.5 F (36.4 C) (Oral)   Resp 20   SpO2 98%   Physical Exam Vitals and nursing note reviewed.  Constitutional:      General: She is in acute distress (Tearful).     Appearance: Normal appearance. She is well-developed.  HENT:     Head: Normocephalic and atraumatic.     Right Ear: Hearing normal.     Left Ear: Hearing normal.     Nose: Nose normal.  Eyes:     Conjunctiva/sclera: Conjunctivae normal.     Pupils: Pupils are equal, round, and reactive to light.  Cardiovascular:     Rate and Rhythm: Regular rhythm.     Heart sounds: S1 normal and S2 normal. No murmur heard.  No friction rub. No gallop.   Pulmonary:  Effort: Pulmonary effort is normal. No respiratory distress.     Breath sounds: Normal breath sounds.  Chest:     Chest wall: No tenderness.  Abdominal:     General: Bowel sounds are normal.     Palpations: Abdomen is soft.     Tenderness: There is generalized abdominal tenderness. There is no guarding or rebound. Negative signs include Murphy's sign and McBurney's sign.     Hernia: No hernia is present.     Comments: No peritoneal signs  Musculoskeletal:        General: Normal range of motion.     Cervical back: Normal range of motion and neck supple.  Skin:    General: Skin is warm and dry.     Findings: No rash.  Neurological:     Mental Status: She is alert and oriented to person, place, and time.     GCS: GCS eye subscore is 4. GCS verbal subscore is 5. GCS motor subscore is 6.     Cranial Nerves: No  cranial nerve deficit.     Sensory: No sensory deficit.     Coordination: Coordination normal.  Psychiatric:        Speech: Speech normal.        Behavior: Behavior normal.        Thought Content: Thought content normal.     ED Results / Procedures / Treatments   Labs (all labs ordered are listed, but only abnormal results are displayed) Labs Reviewed - No data to display  EKG None  Radiology No results found.  Procedures Procedures (including critical care time)  Medications Ordered in ED Medications - No data to display  ED Course  I have reviewed the triage vital signs and the nursing notes.  Pertinent labs & imaging results that were available during my care of the patient were reviewed by me and considered in my medical decision making (see chart for details).    MDM Rules/Calculators/A&P                          Patient presents for nausea and vomiting with diffuse abdominal cramping.  Patient has been seen in the ED recently with similar, at that time she was found to be pregnant.  She did have an IUP confirmed.  No vaginal bleeding or discharge at this time.  Abdominal exam not consistent with acute surgical process.  Will transfer to MAU for further management.  Final Clinical Impression(s) / ED Diagnoses Final diagnoses:  Vomiting during pregnancy    Rx / DC Orders ED Discharge Orders    None       Jemya Depierro, Canary Brim, MD 03/19/20 0330

## 2020-03-19 NOTE — MAU Note (Signed)
Pt reports she has not been able to keep anything down for the last few days, vomiting daily since positive preg. Lower abd cramping. Denies bleeding.

## 2020-03-19 NOTE — Discharge Instructions (Signed)
Morning Sickness  Morning sickness is when you feel sick to your stomach (nauseous) during pregnancy. You may feel sick to your stomach and throw up (vomit). You may feel sick in the morning, but you can feel this way at any time of day. Some women feel very sick to their stomach and cannot stop throwing up (hyperemesis gravidarum). Follow these instructions at home: Medicines  Take over-the-counter and prescription medicines only as told by your doctor. Do not take any medicines until you talk with your doctor about them first.  Taking multivitamins before getting pregnant can stop or lessen the harshness of morning sickness. Eating and drinking  Eat dry toast or crackers before getting out of bed.  Eat 5 or 6 small meals a day.  Eat dry and bland foods like rice and baked potatoes.  Do not eat greasy, fatty, or spicy foods.  Have someone cook for you if the smell of food causes you to feel sick or throw up.  If you feel sick to your stomach after taking prenatal vitamins, take them at night or with a snack.  Eat protein when you need a snack. Nuts, yogurt, and cheese are good choices.  Drink fluids throughout the day.  Try ginger ale made with real ginger, ginger tea made from fresh grated ginger, or ginger candies. General instructions  Do not use any products that have nicotine or tobacco in them, such as cigarettes and e-cigarettes. If you need help quitting, ask your doctor.  Use an air purifier to keep the air in your house free of smells.  Get lots of fresh air.  Try to avoid smells that make you feel sick.  Try: ? Wearing a bracelet that is used for seasickness (acupressure wristband). ? Going to a doctor who puts thin needles into certain body points (acupuncture) to improve how you feel. Contact a doctor if:  You need medicine to feel better.  You feel dizzy or light-headed.  You are losing weight. Get help right away if:  You feel very sick to your  stomach and cannot stop throwing up.  You pass out (faint).  You have very bad pain in your belly. Summary  Morning sickness is when you feel sick to your stomach (nauseous) during pregnancy.  You may feel sick in the morning, but you can feel this way at any time of day.  Making some changes to what you eat may help your symptoms go away. This information is not intended to replace advice given to you by your health care provider. Make sure you discuss any questions you have with your health care provider. Document Revised: 03/12/2017 Document Reviewed: 04/30/2016 Elsevier Patient Education  2020 [employer]  [facility] Providers    [facility]       Phone: [phone]  [facility]   Phone: [phone]  [facility]  Phone: [phone]  [facility] [place] [facility]  Phone: [phone]  [facility] [place]  Phone: [phone]  [facility]   Phone: [phone]  [facility]       Phone: [phone]  [facility]    Phone: [phone]   [facility]    Phone: [phone]  [facility]    Phone: [phone]  [facility]    Phone: [phone]  [facility]       Phone: [phone]   [facility]  Phone: [phone]  [facility]    Phone: [phone]  [facility]   Phone: [phone]  [facility]      Phone: [phone]  [facility]    Phone: [phone]

## 2020-03-19 NOTE — MAU Provider Note (Signed)
History     CSN: [id number]  Arrival date and time: 03/19/20 0302   First Provider Initiated Contact with Patient 03/19/20 0408      Chief Complaint  Patient presents with  . Emesis During Pregnancy   19 y.o. G1 @[redacted]w[redacted]d  by LMP presenting with N/V and abd pain. Reports onset of sx for the last week but have worsened yesterday. She is unable to tolerate anything po. Pain is in uppe abdomen/epigastric region and rates 10/10. Denies fevers and diarrhea. Denies sick contacts. She tried Zofran 2 days ago but didn't help. Denies VB. She reports using a CBD pen with THC to help her eat but it only works temporarily.    OB History    Gravida  1   Para      Term      Preterm      AB      Living        SAB      TAB      Ectopic      Multiple      Live Births              No past medical history on file.  Past Surgical History:  Procedure Laterality Date  . APPENDECTOMY      History reviewed. No pertinent family history.  Social History   Tobacco Use  . Smoking status: Never Smoker  . Smokeless tobacco: Never Used  Vaping Use  . Vaping Use: Never assessed  Substance Use Topics  . Alcohol use: Not Currently  . Drug use: Yes    Comment: CBD vape pen last use 03/18/2020    Allergies:  Allergies  Allergen Reactions  . Latex Rash    Medications Prior to Admission  Medication Sig Dispense Refill Last Dose  . acetaminophen (TYLENOL) 500 MG tablet Take 500-1,000 mg by mouth every 6 (six) hours as needed for mild pain or headache.     . dicyclomine (BENTYL) 20 MG tablet Take 1 tablet (20 mg total) by mouth 2 (two) times daily. (Patient not taking: Reported on 03/08/2020) 20 tablet 0   . famotidine (PEPCID) 20 MG tablet Take 1 tablet (20 mg total) by mouth 2 (two) times daily. (Patient not taking: Reported on 03/08/2020) 30 tablet 0   . ibuprofen (ADVIL) 200 MG tablet Take 200-600 mg by mouth every 6 (six) hours as needed for headache or mild pain.     03/10/2020  ondansetron (ZOFRAN ODT) 4 MG disintegrating tablet Take 1 tablet (4 mg total) by mouth every 8 (eight) hours as needed for nausea or vomiting. 20 tablet 0   . promethazine (PHENERGAN) 25 MG tablet Take 1 tablet (25 mg total) by mouth every 6 (six) hours as needed for nausea or vomiting. 30 tablet 0   . sucralfate (CARAFATE) 1 g tablet Take 1 tablet (1 g total) by mouth 4 (four) times daily -  with meals and at bedtime. (Patient not taking: Reported on 03/08/2020) 28 tablet 0     Review of Systems  Constitutional: Negative for fever.  Gastrointestinal: Positive for abdominal pain, nausea and vomiting. Negative for diarrhea.  Genitourinary: Negative for vaginal bleeding.   Physical Exam   Blood pressure 114/60, pulse 90, temperature 97.7 F (36.5 C), temperature source Oral, resp. rate 18, last menstrual period 02/13/2020, SpO2 98 %.  Physical Exam Vitals and nursing note reviewed.  Constitutional:      General: She is not in acute distress.    Appearance:  Normal appearance.  HENT:     Head: Normocephalic and atraumatic.  Cardiovascular:     Rate and Rhythm: Normal rate.  Pulmonary:     Effort: Pulmonary effort is normal. No respiratory distress.  Abdominal:     General: There is no distension.     Palpations: Abdomen is soft. There is no mass.     Tenderness: There is no abdominal tenderness. There is no guarding or rebound.  Musculoskeletal:        General: Normal range of motion.     Cervical back: Normal range of motion.  Skin:    General: Skin is warm and dry.  Neurological:     General: No focal deficit present.     Mental Status: She is alert and oriented to person, place, and time.  Psychiatric:        Mood and Affect: Mood normal.        Behavior: Behavior normal.    Results for orders placed or performed during the hospital encounter of 03/19/20 (from the past 24 hour(s))  Urinalysis, Routine w reflex microscopic Urine, Clean Catch     Status: Abnormal    Collection Time: 03/19/20  4:00 AM  Result Value Ref Range   Color, Urine YELLOW YELLOW   APPearance HAZY (A) CLEAR   Specific Gravity, Urine 1.030 1.005 - 1.030   pH 5.0 5.0 - 8.0   Glucose, UA NEGATIVE NEGATIVE mg/dL   Hgb urine dipstick NEGATIVE NEGATIVE   Bilirubin Urine NEGATIVE NEGATIVE   Ketones, ur 20 (A) NEGATIVE mg/dL   Protein, ur NEGATIVE NEGATIVE mg/dL   Nitrite NEGATIVE NEGATIVE   Leukocytes,Ua NEGATIVE NEGATIVE  CBC     Status: None   Collection Time: 03/19/20  4:27 AM  Result Value Ref Range   WBC 9.3 4.0 - 10.5 K/uL   RBC 4.75 3.87 - 5.11 MIL/uL   Hemoglobin 14.3 12.0 - 15.0 g/dL   HCT 46.6 36 - 46 %   MCV 87.6 80.0 - 100.0 fL   MCH 30.1 26.0 - 34.0 pg   MCHC 34.4 30.0 - 36.0 g/dL   RDW 59.9 35.7 - 01.7 %   Platelets 286 150 - 400 K/uL   nRBC 0.0 0.0 - 0.2 %  Lipase, blood     Status: None   Collection Time: 03/19/20  4:27 AM  Result Value Ref Range   Lipase 24 11 - 51 U/L  Comprehensive metabolic panel     Status: Abnormal   Collection Time: 03/19/20  4:27 AM  Result Value Ref Range   Sodium 136 135 - 145 mmol/L   Potassium 3.2 (L) 3.5 - 5.1 mmol/L   Chloride 103 98 - 111 mmol/L   CO2 22 22 - 32 mmol/L   Glucose, Bld 101 (H) 70 - 99 mg/dL   BUN 7 6 - 20 mg/dL   Creatinine, Ser 7.93 0.44 - 1.00 mg/dL   Calcium 9.0 8.9 - 90.3 mg/dL   Total Protein 6.6 6.5 - 8.1 g/dL   Albumin 3.8 3.5 - 5.0 g/dL   AST 16 15 - 41 U/L   ALT 14 0 - 44 U/L   Alkaline Phosphatase 46 38 - 126 U/L   Total Bilirubin 0.7 0.3 - 1.2 mg/dL   GFR, Estimated >00 >92 mL/min   Anion gap 11 5 - 15   US OB Transvaginal  Result Date: 03/19/2020 CLINICAL DATA:  Abdominal pain.  Evaluation for fetal viability. EXAM: OBSTETRIC <14 WK [place] AND TRANSVAGINAL OB US  TECHNIQUE: Both transabdominal and transvaginal ultrasound examinations were performed for complete evaluation of the gestation as well as the maternal uterus, adnexal regions, and pelvic cul-de-sac. Transvaginal technique was  performed to assess early pregnancy. COMPARISON:  Ultrasound 03/08/2020. FINDINGS: Intrauterine gestational sac: Single Yolk sac:  Present Embryo:  Present Cardiac Activity: Present Heart Rate: 124 bpm CRL: 10.2 mm   7 w   1 d                  [place] EDC: 11/04/2020 Subchorionic hemorrhage: Very tiny subchorionic hemorrhage cannot be excluded. Maternal uterus/adnexae: Small corpus luteal cyst right ovary. Left ovary not visualized. No free fluid. IMPRESSION: 1.  Single viable intrauterine pregnancy at 7 weeks 1 day. 2.  Very tiny subchorionic hemorrhage cannot be excluded. Electronically Signed   By: Maisie Fus  Register   On: 03/19/2020 05:29   MAU Course  Procedures LR Phenergan Scopolamine  MDM Labs and [place] ordered and reviewed. Tolerating po, feels better but continues to have nausea, no emesis. Normal IUP on [place], EDC changed. Advised to stop CBD vape. Stable for discharge home.  Assessment and Plan   1. Vomiting during pregnancy   2. [redacted] weeks gestation of pregnancy   3. Morning sickness   4. Hypokalemia    Discharge home Follow up with OB provider of choice to start care- list provided Rx Phenergan Rx Scopolamine Rx Kdur Return precautions  Allergies as of 03/19/2020      Reactions   Latex Rash      Medication List    STOP taking these medications   ibuprofen 200 MG tablet Commonly known as: ADVIL   ondansetron 4 MG disintegrating tablet Commonly known as: Zofran ODT   sucralfate 1 g tablet Commonly known as: Carafate     TAKE these medications   acetaminophen 500 MG tablet Commonly known as: TYLENOL Take 500-1,000 mg by mouth every 6 (six) hours as needed for mild pain or headache.   dicyclomine 20 MG tablet Commonly known as: BENTYL Take 1 tablet (20 mg total) by mouth 2 (two) times daily.   famotidine 20 MG tablet Commonly known as: PEPCID Take 1 tablet (20 mg total) by mouth 2 (two) times daily.   potassium chloride SA 20 MEQ tablet Commonly known as: KLOR-CON  Take 1 tablet (20 mEq total) by mouth 2 (two) times daily.   promethazine 25 MG tablet Commonly known as: PHENERGAN Take 0.5-1 tablets (12.5-25 mg total) by mouth every 6 (six) hours as needed for nausea or vomiting. What changed: how much to take   scopolamine 1 MG/3DAYS Commonly known as: TRANSDERM-SCOP Place 1 patch (1.5 mg total) onto the skin every 3 (three) days. Start taking on: March 22, 2020      Donette Larry, CNM 03/19/2020, 4:20 AM

## 2020-04-13 DIAGNOSIS — Z419 Encounter for procedure for purposes other than remedying health state, unspecified: Secondary | ICD-10-CM | POA: Diagnosis not present

## 2020-05-14 DIAGNOSIS — Z419 Encounter for procedure for purposes other than remedying health state, unspecified: Secondary | ICD-10-CM | POA: Diagnosis not present

## 2020-06-11 DIAGNOSIS — Z419 Encounter for procedure for purposes other than remedying health state, unspecified: Secondary | ICD-10-CM | POA: Diagnosis not present

## 2020-07-12 DIAGNOSIS — Z419 Encounter for procedure for purposes other than remedying health state, unspecified: Secondary | ICD-10-CM | POA: Diagnosis not present

## 2020-08-11 DIAGNOSIS — Z419 Encounter for procedure for purposes other than remedying health state, unspecified: Secondary | ICD-10-CM | POA: Diagnosis not present

## 2020-09-11 DIAGNOSIS — Z419 Encounter for procedure for purposes other than remedying health state, unspecified: Secondary | ICD-10-CM | POA: Diagnosis not present

## 2020-10-11 DIAGNOSIS — Z419 Encounter for procedure for purposes other than remedying health state, unspecified: Secondary | ICD-10-CM | POA: Diagnosis not present

## 2020-11-11 DIAGNOSIS — Z419 Encounter for procedure for purposes other than remedying health state, unspecified: Secondary | ICD-10-CM | POA: Diagnosis not present

## 2020-12-12 DIAGNOSIS — Z419 Encounter for procedure for purposes other than remedying health state, unspecified: Secondary | ICD-10-CM | POA: Diagnosis not present

## 2021-01-11 DIAGNOSIS — Z419 Encounter for procedure for purposes other than remedying health state, unspecified: Secondary | ICD-10-CM | POA: Diagnosis not present

## 2021-02-11 DIAGNOSIS — Z419 Encounter for procedure for purposes other than remedying health state, unspecified: Secondary | ICD-10-CM | POA: Diagnosis not present

## 2021-03-13 DIAGNOSIS — Z419 Encounter for procedure for purposes other than remedying health state, unspecified: Secondary | ICD-10-CM | POA: Diagnosis not present

## 2021-04-13 DIAGNOSIS — Z419 Encounter for procedure for purposes other than remedying health state, unspecified: Secondary | ICD-10-CM | POA: Diagnosis not present

## 2021-05-14 DIAGNOSIS — Z419 Encounter for procedure for purposes other than remedying health state, unspecified: Secondary | ICD-10-CM | POA: Diagnosis not present

## 2021-06-11 DIAGNOSIS — Z419 Encounter for procedure for purposes other than remedying health state, unspecified: Secondary | ICD-10-CM | POA: Diagnosis not present

## 2021-07-12 DIAGNOSIS — Z419 Encounter for procedure for purposes other than remedying health state, unspecified: Secondary | ICD-10-CM | POA: Diagnosis not present

## 2021-08-11 DIAGNOSIS — Z419 Encounter for procedure for purposes other than remedying health state, unspecified: Secondary | ICD-10-CM | POA: Diagnosis not present

## 2021-09-11 DIAGNOSIS — Z419 Encounter for procedure for purposes other than remedying health state, unspecified: Secondary | ICD-10-CM | POA: Diagnosis not present

## 2021-10-11 DIAGNOSIS — Z419 Encounter for procedure for purposes other than remedying health state, unspecified: Secondary | ICD-10-CM | POA: Diagnosis not present

## 2021-11-11 DIAGNOSIS — Z419 Encounter for procedure for purposes other than remedying health state, unspecified: Secondary | ICD-10-CM | POA: Diagnosis not present

## 2021-12-12 DIAGNOSIS — Z419 Encounter for procedure for purposes other than remedying health state, unspecified: Secondary | ICD-10-CM | POA: Diagnosis not present

## 2021-12-26 IMAGING — US US ABDOMEN LIMITED
1 series · 14 of 25 positions shown · non-contrast
Comparison: None.

CLINICAL DATA: Right upper quadrant pain

EXAM:
ULTRASOUND ABDOMEN LIMITED RIGHT UPPER QUADRANT

[Series 1: us abdomen limited ruq · 14 of 39 slices shown]
[im 1/39]
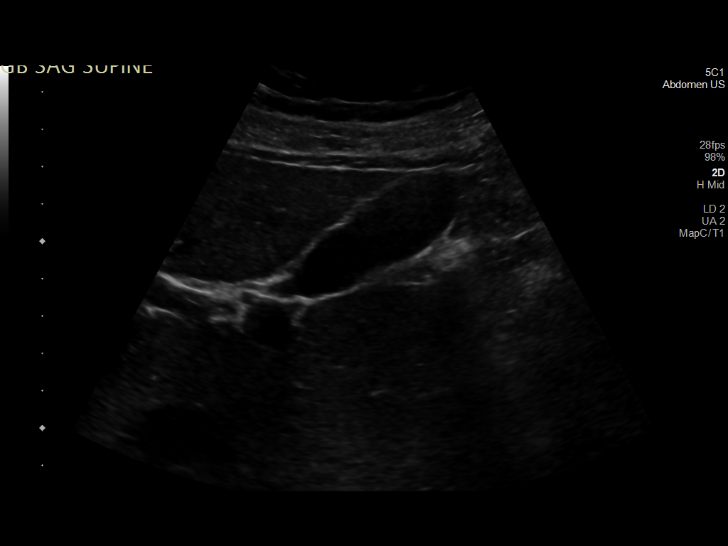
[im 4/39]
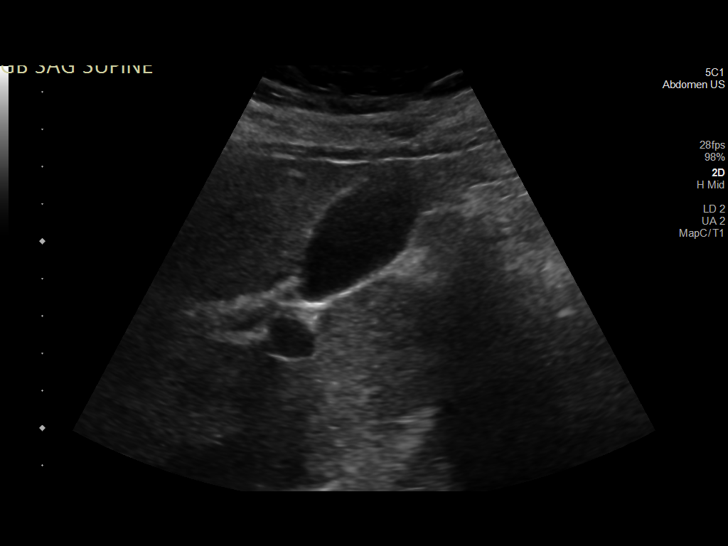
[im 7/39]
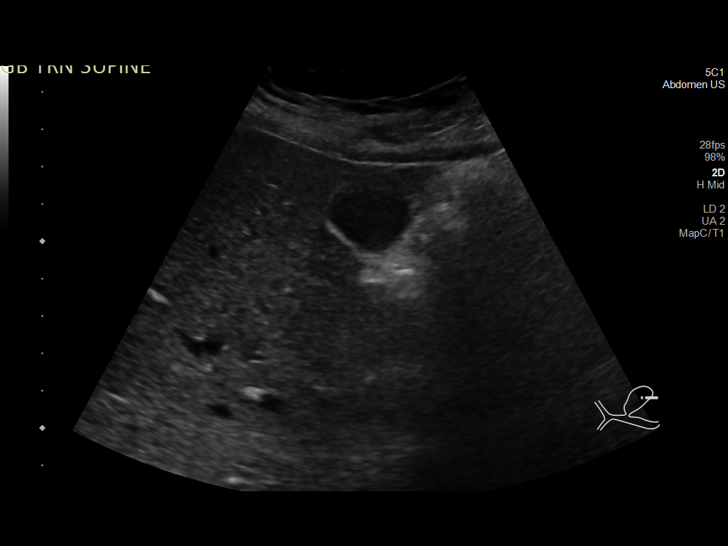
[im 10/39]
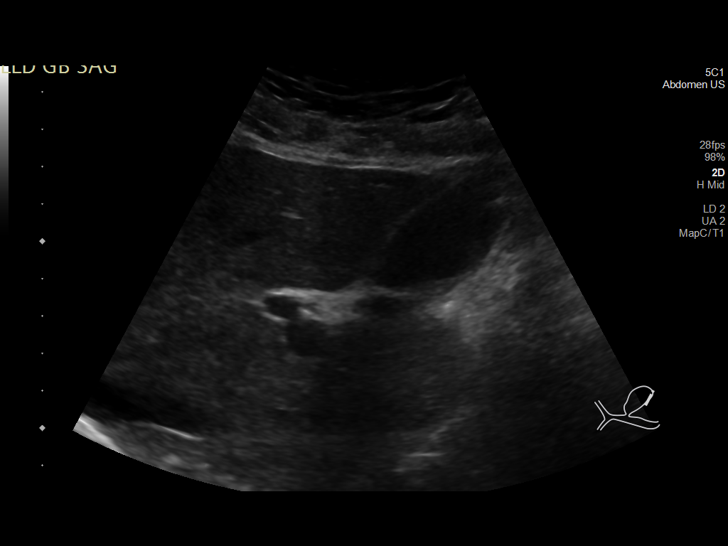
[im 13/39]
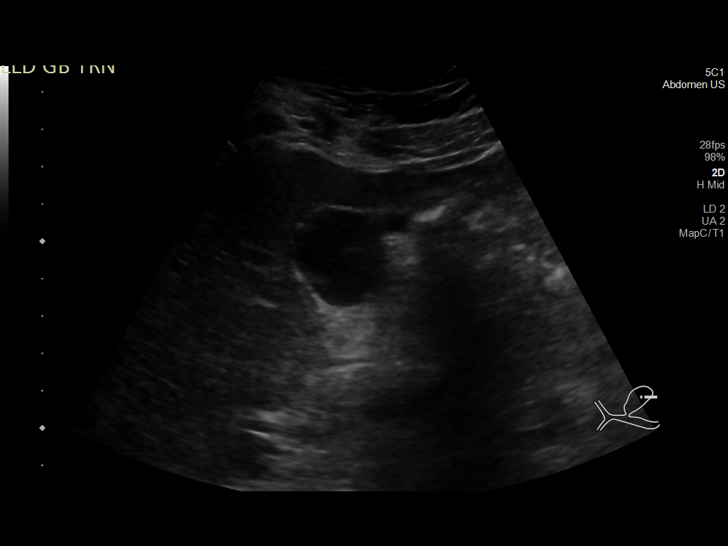
[im 15/39]
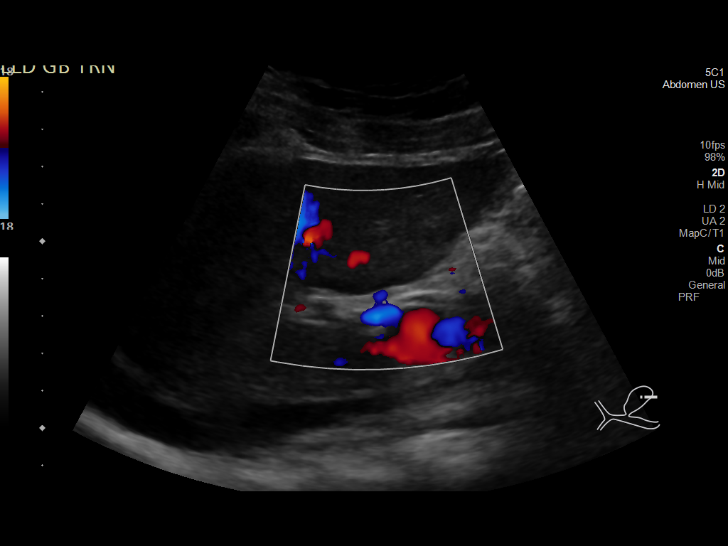
[im 18/39]
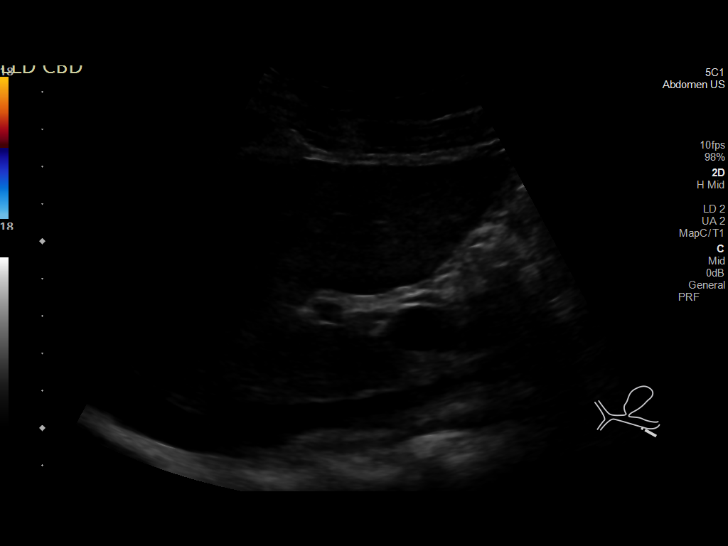
[im 21/39]
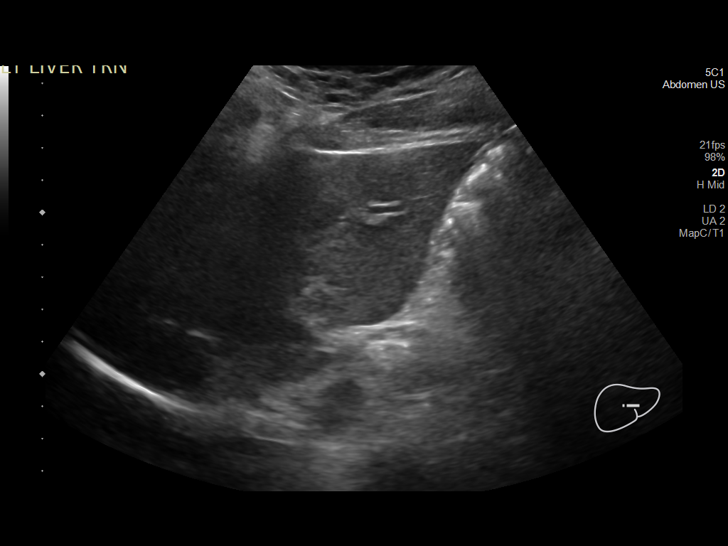
[im 24/39]
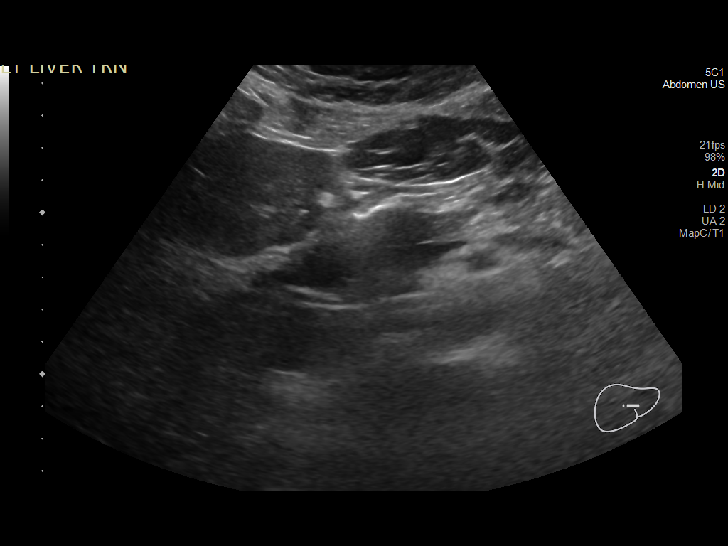
[im 26/39]
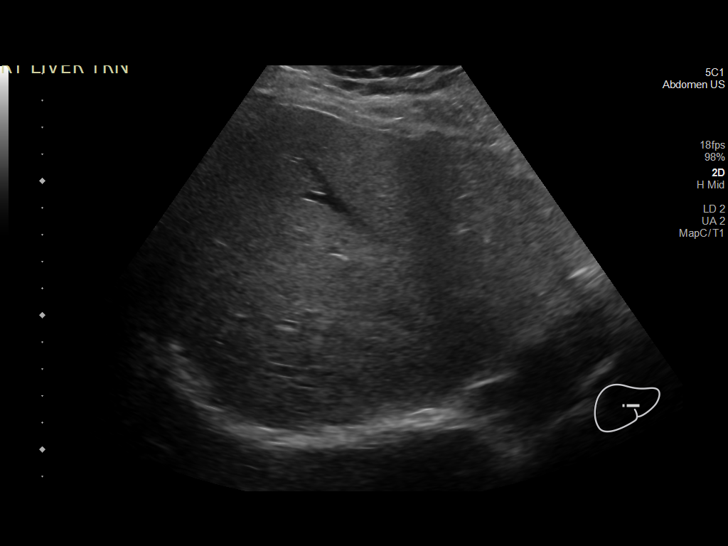
[im 29/39]
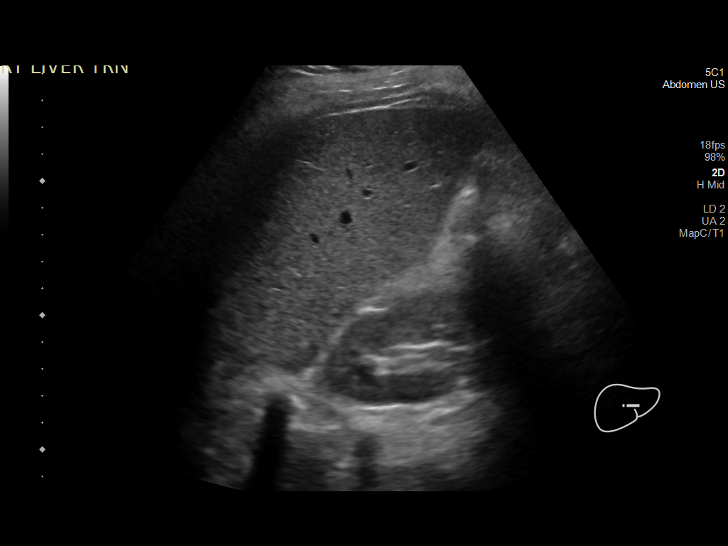
[im 32/39]
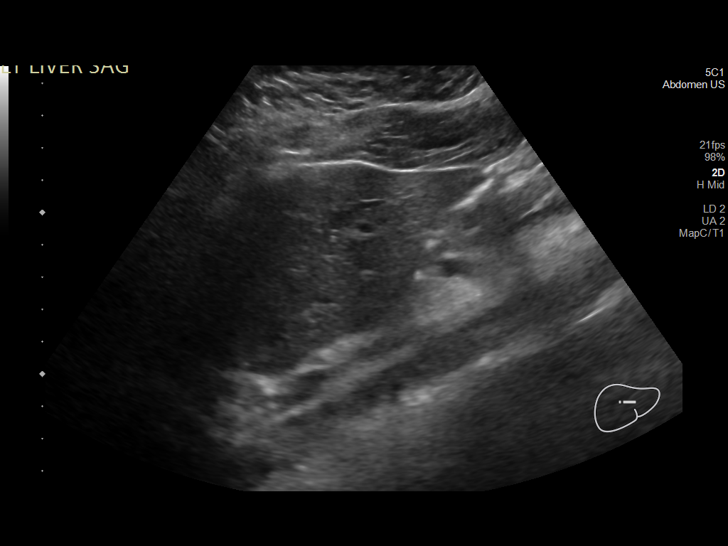
[im 35/39]
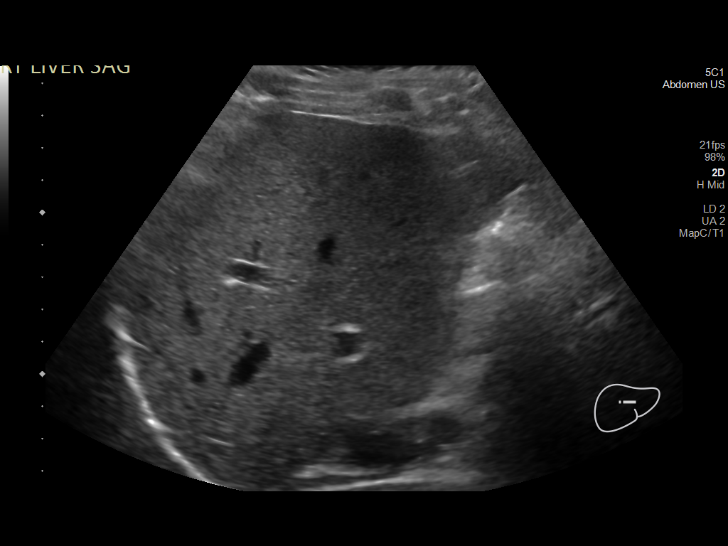
[im 39/39]
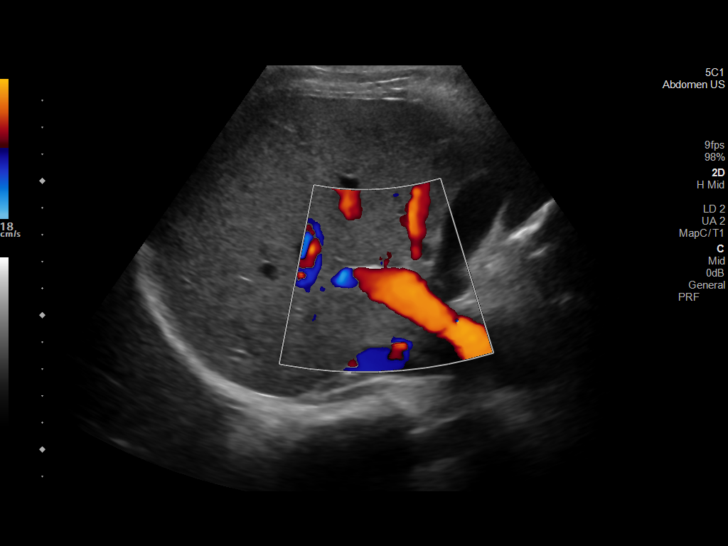

[14 of 25 positions shown; findings below may reference images not displayed]

FINDINGS: Gallbladder:

No gallstones or wall thickening visualized. No sonographic Murphy
sign noted by sonographer.

Common bile duct:

Diameter: 3 mm

Liver:

Liver is slightly echogenic. No focal hepatic abnormality portal
vein is patent on color Doppler imaging with normal direction of
blood flow towards the liver.

Other: None.
IMPRESSION: 1. Negative for gallstones or biliary dilatation
2. Slightly echogenic liver as may be seen with steatosis

## 2022-01-11 DIAGNOSIS — Z419 Encounter for procedure for purposes other than remedying health state, unspecified: Secondary | ICD-10-CM | POA: Diagnosis not present

## 2022-02-11 DIAGNOSIS — Z419 Encounter for procedure for purposes other than remedying health state, unspecified: Secondary | ICD-10-CM | POA: Diagnosis not present

## 2022-03-13 DIAGNOSIS — Z419 Encounter for procedure for purposes other than remedying health state, unspecified: Secondary | ICD-10-CM | POA: Diagnosis not present

## 2022-04-13 DIAGNOSIS — Z419 Encounter for procedure for purposes other than remedying health state, unspecified: Secondary | ICD-10-CM | POA: Diagnosis not present

## 2022-05-14 DIAGNOSIS — Z419 Encounter for procedure for purposes other than remedying health state, unspecified: Secondary | ICD-10-CM | POA: Diagnosis not present

## 2022-06-12 DIAGNOSIS — Z419 Encounter for procedure for purposes other than remedying health state, unspecified: Secondary | ICD-10-CM | POA: Diagnosis not present

## 2022-07-13 DIAGNOSIS — Z419 Encounter for procedure for purposes other than remedying health state, unspecified: Secondary | ICD-10-CM | POA: Diagnosis not present

## 2022-08-12 DIAGNOSIS — Z419 Encounter for procedure for purposes other than remedying health state, unspecified: Secondary | ICD-10-CM | POA: Diagnosis not present

## 2022-09-12 DIAGNOSIS — Z419 Encounter for procedure for purposes other than remedying health state, unspecified: Secondary | ICD-10-CM | POA: Diagnosis not present

## 2022-10-12 DIAGNOSIS — Z419 Encounter for procedure for purposes other than remedying health state, unspecified: Secondary | ICD-10-CM | POA: Diagnosis not present

## 2022-11-12 DIAGNOSIS — Z419 Encounter for procedure for purposes other than remedying health state, unspecified: Secondary | ICD-10-CM | POA: Diagnosis not present

## 2022-12-13 DIAGNOSIS — Z419 Encounter for procedure for purposes other than remedying health state, unspecified: Secondary | ICD-10-CM | POA: Diagnosis not present

## 2023-01-12 DIAGNOSIS — Z419 Encounter for procedure for purposes other than remedying health state, unspecified: Secondary | ICD-10-CM | POA: Diagnosis not present

## 2023-02-12 DIAGNOSIS — Z419 Encounter for procedure for purposes other than remedying health state, unspecified: Secondary | ICD-10-CM | POA: Diagnosis not present

## 2023-03-14 DIAGNOSIS — Z419 Encounter for procedure for purposes other than remedying health state, unspecified: Secondary | ICD-10-CM | POA: Diagnosis not present

## 2023-04-14 DIAGNOSIS — Z419 Encounter for procedure for purposes other than remedying health state, unspecified: Secondary | ICD-10-CM | POA: Diagnosis not present

## 2023-05-15 DIAGNOSIS — Z419 Encounter for procedure for purposes other than remedying health state, unspecified: Secondary | ICD-10-CM | POA: Diagnosis not present

## 2023-06-12 DIAGNOSIS — Z419 Encounter for procedure for purposes other than remedying health state, unspecified: Secondary | ICD-10-CM | POA: Diagnosis not present

## 2023-07-24 DIAGNOSIS — Z419 Encounter for procedure for purposes other than remedying health state, unspecified: Secondary | ICD-10-CM | POA: Diagnosis not present

## 2023-08-23 DIAGNOSIS — Z419 Encounter for procedure for purposes other than remedying health state, unspecified: Secondary | ICD-10-CM | POA: Diagnosis not present

## 2023-09-23 DIAGNOSIS — Z419 Encounter for procedure for purposes other than remedying health state, unspecified: Secondary | ICD-10-CM | POA: Diagnosis not present

## 2023-10-23 DIAGNOSIS — Z419 Encounter for procedure for purposes other than remedying health state, unspecified: Secondary | ICD-10-CM | POA: Diagnosis not present

## 2023-11-23 DIAGNOSIS — Z419 Encounter for procedure for purposes other than remedying health state, unspecified: Secondary | ICD-10-CM | POA: Diagnosis not present

## 2023-12-24 DIAGNOSIS — Z419 Encounter for procedure for purposes other than remedying health state, unspecified: Secondary | ICD-10-CM | POA: Diagnosis not present
